# Patient Record
Sex: Male | Born: 1990 | Race: White | Hispanic: No | Marital: Single | Smoking: Current every day smoker
Health system: Southern US, Community
[De-identification: ages and names within clinical notes are randomized; demographics above are authoritative.]

## PROBLEM LIST (undated history)

## (undated) HISTORY — PX: MANDIBLE FRACTURE SURGERY: SHX706

---
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
## 2014-01-27 ENCOUNTER — Emergency Department (HOSPITAL_BASED_OUTPATIENT_CLINIC_OR_DEPARTMENT_OTHER): Payer: Self-pay

## 2014-01-27 ENCOUNTER — Emergency Department (HOSPITAL_BASED_OUTPATIENT_CLINIC_OR_DEPARTMENT_OTHER)
Admission: EM | Admit: 2014-01-27 | Discharge: 2014-01-27 | Disposition: A | Discharge status: Home / Self Care | Payer: Self-pay | Attending: Emergency Medicine | Admitting: Emergency Medicine

## 2014-01-27 ENCOUNTER — Encounter (HOSPITAL_BASED_OUTPATIENT_CLINIC_OR_DEPARTMENT_OTHER): Payer: Self-pay | Admitting: Emergency Medicine

## 2014-01-27 DIAGNOSIS — Y9389 Activity, other specified: Secondary | ICD-10-CM | POA: Insufficient documentation

## 2014-01-27 DIAGNOSIS — Y9289 Other specified places as the place of occurrence of the external cause: Secondary | ICD-10-CM | POA: Insufficient documentation

## 2014-01-27 DIAGNOSIS — W19XXXA Unspecified fall, initial encounter: Secondary | ICD-10-CM

## 2014-01-27 DIAGNOSIS — IMO0002 Reserved for concepts with insufficient information to code with codable children: Secondary | ICD-10-CM | POA: Insufficient documentation

## 2014-01-27 DIAGNOSIS — F17200 Nicotine dependence, unspecified, uncomplicated: Secondary | ICD-10-CM | POA: Insufficient documentation

## 2014-01-27 DIAGNOSIS — R296 Repeated falls: Secondary | ICD-10-CM | POA: Insufficient documentation

## 2014-01-27 DIAGNOSIS — F10229 Alcohol dependence with intoxication, unspecified: Secondary | ICD-10-CM | POA: Insufficient documentation

## 2014-01-27 DIAGNOSIS — F10929 Alcohol use, unspecified with intoxication, unspecified: Secondary | ICD-10-CM

## 2014-01-27 NOTE — ED Notes (Signed)
Pt alert and answering questions appropriately. Abrasions noted to left cheek, left side of forehead, and left shoulder. Last tetanus shot 2 years ago upon entering prison per pt.
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
## 2014-01-27 NOTE — Discharge Instructions (Signed)
I discussed, it is important that you drink in moderation, and avoid illicit substances.  Please be sure to followup with your physician to ensure that today's event was not due to a cause other than alcohol intoxication and cocaine use.  Return here for concerning changes in your condition.
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
